# Patient Record
Sex: Male | Born: 1990 | Race: Black or African American | Hispanic: No | Marital: Single | Smoking: Former smoker
Health system: Southern US, Community
[De-identification: ages and names within clinical notes are randomized; demographics above are authoritative.]

---

## 2018-08-22 ENCOUNTER — Other Ambulatory Visit: Payer: Self-pay

## 2018-08-22 ENCOUNTER — Encounter (HOSPITAL_BASED_OUTPATIENT_CLINIC_OR_DEPARTMENT_OTHER): Payer: Self-pay

## 2018-08-22 ENCOUNTER — Emergency Department (HOSPITAL_BASED_OUTPATIENT_CLINIC_OR_DEPARTMENT_OTHER)
Admission: EM | Admit: 2018-08-22 | Discharge: 2018-08-22 | Disposition: A | Discharge status: Home / Self Care | Payer: Self-pay | Attending: Emergency Medicine | Admitting: Emergency Medicine

## 2018-08-22 DIAGNOSIS — L293 Anogenital pruritus, unspecified: Secondary | ICD-10-CM

## 2018-08-22 DIAGNOSIS — F17210 Nicotine dependence, cigarettes, uncomplicated: Secondary | ICD-10-CM | POA: Insufficient documentation

## 2018-08-22 DIAGNOSIS — A630 Anogenital (venereal) warts: Secondary | ICD-10-CM | POA: Insufficient documentation

## 2018-08-22 LAB — URINALYSIS, ROUTINE W REFLEX MICROSCOPIC
BILIRUBIN URINE: NEGATIVE
Glucose, UA: NEGATIVE mg/dL
HGB URINE DIPSTICK: NEGATIVE
Ketones, ur: 15 mg/dL — AB
Leukocytes, UA: NEGATIVE
Nitrite: NEGATIVE
PH: 8 (ref 5.0–8.0)
Protein, ur: NEGATIVE mg/dL
SPECIFIC GRAVITY, URINE: 1.015 (ref 1.005–1.030)

## 2018-08-22 NOTE — Discharge Instructions (Addendum)
Thank you for allowing me to care for you today in the Emergency Department.   Avoid use of any detergents, soaps, perfumes, condoms, and other chemicals, such as petroleum jelly, which may cause worsening itching.  Make sure that you dry the area thoroughly after getting out of the shower before putting on underwear.  You can also try using a Goldbond powder to the groin area, the area around the top of the bilateral thighs.   If your symptoms do not improve with this regimen, you can follow-up with [facility].  You can also follow-up with [facility] regarding the warts on your penis.  Your gonorrhea and Chlamydia test are pending today.  If positive, someone from the hospital will call you.  Return to the emergency department if you develop blood in your urine, burning when you pee, high fevers, swelling of the penis or scrotum, or other new, concerning symptoms.

## 2018-08-22 NOTE — ED Provider Notes (Signed)
[facility] EMERGENCY DEPARTMENT Provider Note   CSN: [id number] Arrival date & time: 08/22/18  1732     History   Chief Complaint Chief Complaint  Patient presents with  . Sore    HPI Kurt Moon is a 27 y.o. male with no pertinent past medical history who presents to the emergency department with a chief complaint of penile itching.  The patient endorses itching to the penis that began over the last few days.  He also reports multiple "bumps" to the penis that is been present for at least a year.  He denies penile discharge, dysuria, hematuria, rectal pain, rectal itching, sore throat, fever, chills, urinary frequency or hesitancy, back pain, or abdominal pain.  No treatment prior to arrival.  The history is provided by the patient. No language interpreter was used.    History reviewed. No pertinent past medical history.  There are no active problems to display for this patient.   History reviewed. No pertinent surgical history.      Home Medications    Prior to Admission medications   Not on File    Family History No family history on file.  Social History Social History   Tobacco Use  . Smoking status: Current Every Day Smoker    Types: Cigarettes  . Smokeless tobacco: Never Used  Substance Use Topics  . Alcohol use: Yes    Comment: occ  . Drug use: Never     Allergies   Patient has no known allergies.   Review of Systems Review of Systems  Constitutional: Negative for appetite change and fever.  Respiratory: Negative for shortness of breath.   Cardiovascular: Negative for chest pain.  Gastrointestinal: Negative for abdominal pain.  Genitourinary: Negative for dysuria.       Penis itching  Musculoskeletal: Negative for back pain.  Skin: Positive for rash. Negative for color change and wound.  Allergic/Immunologic: Negative for immunocompromised state.  Neurological: Negative for weakness, numbness and headaches.    Psychiatric/Behavioral: Negative for confusion.     Physical Exam Updated Vital Signs BP (!) 154/97 (BP Location: Right Arm)   Pulse 93   Temp 98.5 F (36.9 C) (Oral)   Resp 18   Ht 5\' 8"  (1.727 m)   Wt 97.1 kg   SpO2 98%   BMI 32.54 kg/m   Physical Exam  Constitutional: He appears well-developed.  HENT:  Head: Normocephalic.  Eyes: Conjunctivae are normal.  Neck: Neck supple.  Cardiovascular: Normal rate and regular rhythm.  No murmur heard. Pulmonary/Chest: Effort normal.  Abdominal: Soft. He exhibits no distension and no mass. There is no tenderness. There is no rebound and no guarding. No hernia.  Genitourinary: Testes normal. No phimosis, paraphimosis, penile erythema or penile tenderness. No discharge found.  Genitourinary Comments: Chaperoned  Multiple flesh-colored pedunculated lesions present over the penile shaft.  No erythema, edema, noticed at the urethral meatus, glans penis, or shaft of the penis.  Scrotum is unremarkable.  No discharge noticed from the urethral meatus.  Lymphadenopathy: No inguinal adenopathy noted on the right or left side.  Neurological: He is alert.  Skin: Skin is warm and dry.  Psychiatric: His behavior is normal.  Nursing note and vitals reviewed.  ED Treatments / Results  Labs (all labs ordered are listed, but only abnormal results are displayed) Labs Reviewed  URINALYSIS, ROUTINE W REFLEX MICROSCOPIC - Abnormal; Notable for the following components:      Result Value   Ketones, ur 15 (*)  All other components within normal limits  GC/CHLAMYDIA PROBE AMP ([place]) NOT AT [facility]    EKG None  Radiology No results found.  Procedures Procedures (including critical care time)  Medications Ordered in ED Medications - No data to display   Initial Impression / Assessment and Plan / ED Course  I have reviewed the triage vital signs and the nursing notes.  Pertinent labs & imaging results that were available during my  care of the patient were reviewed by me and considered in my medical decision making (see chart for details).     27 year old male with no pertinent past medical history presenting with penile itching and rash.  Patient endorses multiple flesh-colored, pedunculated lesions to the shaft of the penis that are consistent with genital warts.  These of been present for the last year, but penile itching is acute.  On exam, no evidence of balanitis.  UA is unremarkable.  Gonorrhea and Chlamydia are pending, but less likely given that the patient has no other associated symptoms.  In the absence of physical findings on exam, will discharge the patient with supportive measures for good hygiene and follow-up with urology for genital warts and if pruritus does not resolve with supportive care.  Strict return precautions given.  The patient is hemodynamically stable and in no acute distress.  He is safe for discharge home with outpatient follow-up.  Final Clinical Impressions(s) / ED Diagnoses   Final diagnoses:  Penile pruritus  Genital warts    ED Discharge Orders    None       Avrey Hyser A, PA-C 08/23/18 0104    Rolan Bucco, MD 08/25/18 1235

## 2018-08-22 NOTE — ED Triage Notes (Signed)
C/o bumps, itching to penis x "couple weeks"-denies pain and d/c-NAD-steady gait

## 2018-08-23 LAB — GC/CHLAMYDIA PROBE AMP (~~LOC~~) NOT AT ARMC
Chlamydia: NEGATIVE
Neisseria Gonorrhea: NEGATIVE

## 2018-12-08 ENCOUNTER — Emergency Department (HOSPITAL_BASED_OUTPATIENT_CLINIC_OR_DEPARTMENT_OTHER)
Admission: EM | Admit: 2018-12-08 | Discharge: 2018-12-08 | Disposition: A | Discharge status: Home / Self Care | Payer: Self-pay | Attending: Emergency Medicine | Admitting: Emergency Medicine

## 2018-12-08 ENCOUNTER — Other Ambulatory Visit: Payer: Self-pay

## 2018-12-08 ENCOUNTER — Encounter (HOSPITAL_BASED_OUTPATIENT_CLINIC_OR_DEPARTMENT_OTHER): Payer: Self-pay | Admitting: *Deleted

## 2018-12-08 ENCOUNTER — Emergency Department (HOSPITAL_BASED_OUTPATIENT_CLINIC_OR_DEPARTMENT_OTHER): Payer: Self-pay

## 2018-12-08 DIAGNOSIS — J069 Acute upper respiratory infection, unspecified: Secondary | ICD-10-CM | POA: Insufficient documentation

## 2018-12-08 DIAGNOSIS — R030 Elevated blood-pressure reading, without diagnosis of hypertension: Secondary | ICD-10-CM | POA: Insufficient documentation

## 2018-12-08 DIAGNOSIS — B9789 Other viral agents as the cause of diseases classified elsewhere: Secondary | ICD-10-CM

## 2018-12-08 DIAGNOSIS — F17210 Nicotine dependence, cigarettes, uncomplicated: Secondary | ICD-10-CM | POA: Insufficient documentation

## 2018-12-08 LAB — GROUP A STREP BY PCR: GROUP A STREP BY PCR: NOT DETECTED

## 2018-12-08 MED ORDER — BENZONATATE 100 MG PO CAPS
100.0000 mg | ORAL_CAPSULE | Freq: Once | ORAL | Status: AC
  Administered 2018-12-08: 100 mg via ORAL
  Filled 2018-12-08: qty 1

## 2018-12-08 MED ORDER — BENZONATATE 100 MG PO CAPS: 100.0000 mg | ORAL_CAPSULE | Freq: Three times a day (TID) | ORAL | 0 refills | Status: AC | PRN

## 2018-12-08 NOTE — ED Provider Notes (Signed)
[facility] EMERGENCY DEPARTMENT Provider Note   CSN: [id number] Arrival date & time: 12/08/18  1613    History   Chief Complaint Chief Complaint  Patient presents with  . Cough    flu Sx    HPI Kurt Moon is a 28 y.o. male.     The history is provided by the patient. No language interpreter was used.  Cough   Kurt Moon is a 28 y.o. male who presents to the Emergency Department complaining of cough.  He presents to the ED complaining of cough that began two weeks ago.  He has associated body aches, chills, sore throat, nasal congestion.  Sxs began with cough, sore throat has been present for one week described as soreness anteriorly with coughing.  Cough is productive of green sputum.  For the last few days his face around his eyes has felt swollen in the morning.  No reports of fevers, N/V/abdominal pain, leg swelling or pain.  He does have sick contacts in the household with URI sxs.  No recent travel.  He works in a [employer] administrator.  He has tried OTC cough medications with minimal relief.  He smokes tobacco and uses marijuana, occasional alcohol use.  No urinary changes History reviewed. No pertinent past medical history.  There are no active problems to display for this patient.   History reviewed. No pertinent surgical history.      Home Medications    Prior to Admission medications   Medication Sig Start Date End Date Taking? Authorizing Provider  benzonatate (TESSALON) 100 MG capsule Take 1 capsule (100 mg total) by mouth 3 (three) times daily as needed for cough. 12/08/18   Tilden Fossa, MD    Family History No family history on file.  Social History Social History   Tobacco Use  . Smoking status: Current Every Day Smoker    Types: Cigarettes  . Smokeless tobacco: Never Used  Substance Use Topics  . Alcohol use: Yes    Comment: occ  . Drug use: Never     Allergies   Patient has no known allergies.   Review of  Systems Review of Systems  Respiratory: Positive for cough.   All other systems reviewed and are negative.    Physical Exam Updated Vital Signs BP (!) 158/106 (BP Location: Right Arm)   Pulse 88   Temp 98.5 F (36.9 C) (Oral)   Resp 18   Ht 6' (1.829 m)   Wt 99.8 kg   SpO2 100%   BMI 29.84 kg/m   Physical Exam Vitals signs and nursing note reviewed.  Constitutional:      Appearance: He is well-developed.  HENT:     Head: Normocephalic and atraumatic.     Mouth/Throat:     Mouth: Mucous membranes are moist.     Comments: No significant erythema, edema or exudates in posterior OP Eyes:     Conjunctiva/sclera: Conjunctivae normal.     Comments: No significant peri orbital edema.   Neck:     Musculoskeletal: Neck supple.  Cardiovascular:     Rate and Rhythm: Normal rate and regular rhythm.     Heart sounds: No murmur.  Pulmonary:     Effort: Pulmonary effort is normal. No respiratory distress.     Breath sounds: Normal breath sounds.  Abdominal:     Palpations: Abdomen is soft.     Tenderness: There is no abdominal tenderness. There is no guarding or rebound.  Musculoskeletal:  General: No swelling or tenderness.  Lymphadenopathy:     Cervical: No cervical adenopathy.  Skin:    General: Skin is warm and dry.  Neurological:     Mental Status: He is alert and oriented to person, place, and time.  Psychiatric:        Behavior: Behavior normal.      ED Treatments / Results  Labs (all labs ordered are listed, but only abnormal results are displayed) Labs Reviewed  GROUP A STREP BY PCR    EKG None  Radiology Dg Chest 2 View  Result Date: 12/08/2018 CLINICAL DATA:  Cough, congestion, fever EXAM: CHEST - 2 VIEW COMPARISON:  None. FINDINGS: The heart size and mediastinal contours are within normal limits. Low volume examination without acute airspace opacity. The visualized skeletal structures are unremarkable. IMPRESSION: Low volume examination without  acute abnormality of the lungs. Electronically Signed   By: Lauralyn Primes M.D.   On: 12/08/2018 17:09    Procedures Procedures (including critical care time)  Medications Ordered in ED Medications  benzonatate (TESSALON) capsule 100 mg (100 mg Oral Given 12/08/18 1651)     Initial Impression / Assessment and Plan / ED Course  I have reviewed the triage vital signs and the nursing notes.  Pertinent labs & imaging results that were available during my care of the patient were reviewed by me and considered in my medical decision making (see chart for details).        Patient here for evaluation of cough, sore throat and facial swelling. He is non-toxic appearing on evaluation with no respiratory distress. There is no clear evidence of facial edema, CHF, pneumonia, PTA, RPA, epiglotitis on evaluation. He has multiple sick contacts at home. Discussed home care for viral URI with cough. Discussed with patient findings of elevated blood pressure. Discussed outpatient follow-up and return precautions.  Final Clinical Impressions(s) / ED Diagnoses   Final diagnoses:  Viral URI with cough  Elevated blood pressure reading without diagnosis of hypertension    ED Discharge Orders         Ordered    benzonatate (TESSALON) 100 MG capsule  3 times daily PRN     12/08/18 1723           Tilden Fossa, MD 12/08/18 1725

## 2018-12-08 NOTE — ED Triage Notes (Addendum)
Pt reports cough x 2 weeks and "flu symptoms". States having facial swelling x 3 days and indicates around his eyes. Reports sore throat since thursday

## 2020-03-13 IMAGING — CR DG CHEST 2V
2 series · 2 of 2 positions shown · non-contrast
Comparison: None.

CLINICAL DATA: Cough, congestion, fever

EXAM:
CHEST - 2 VIEW

[w chest pa]
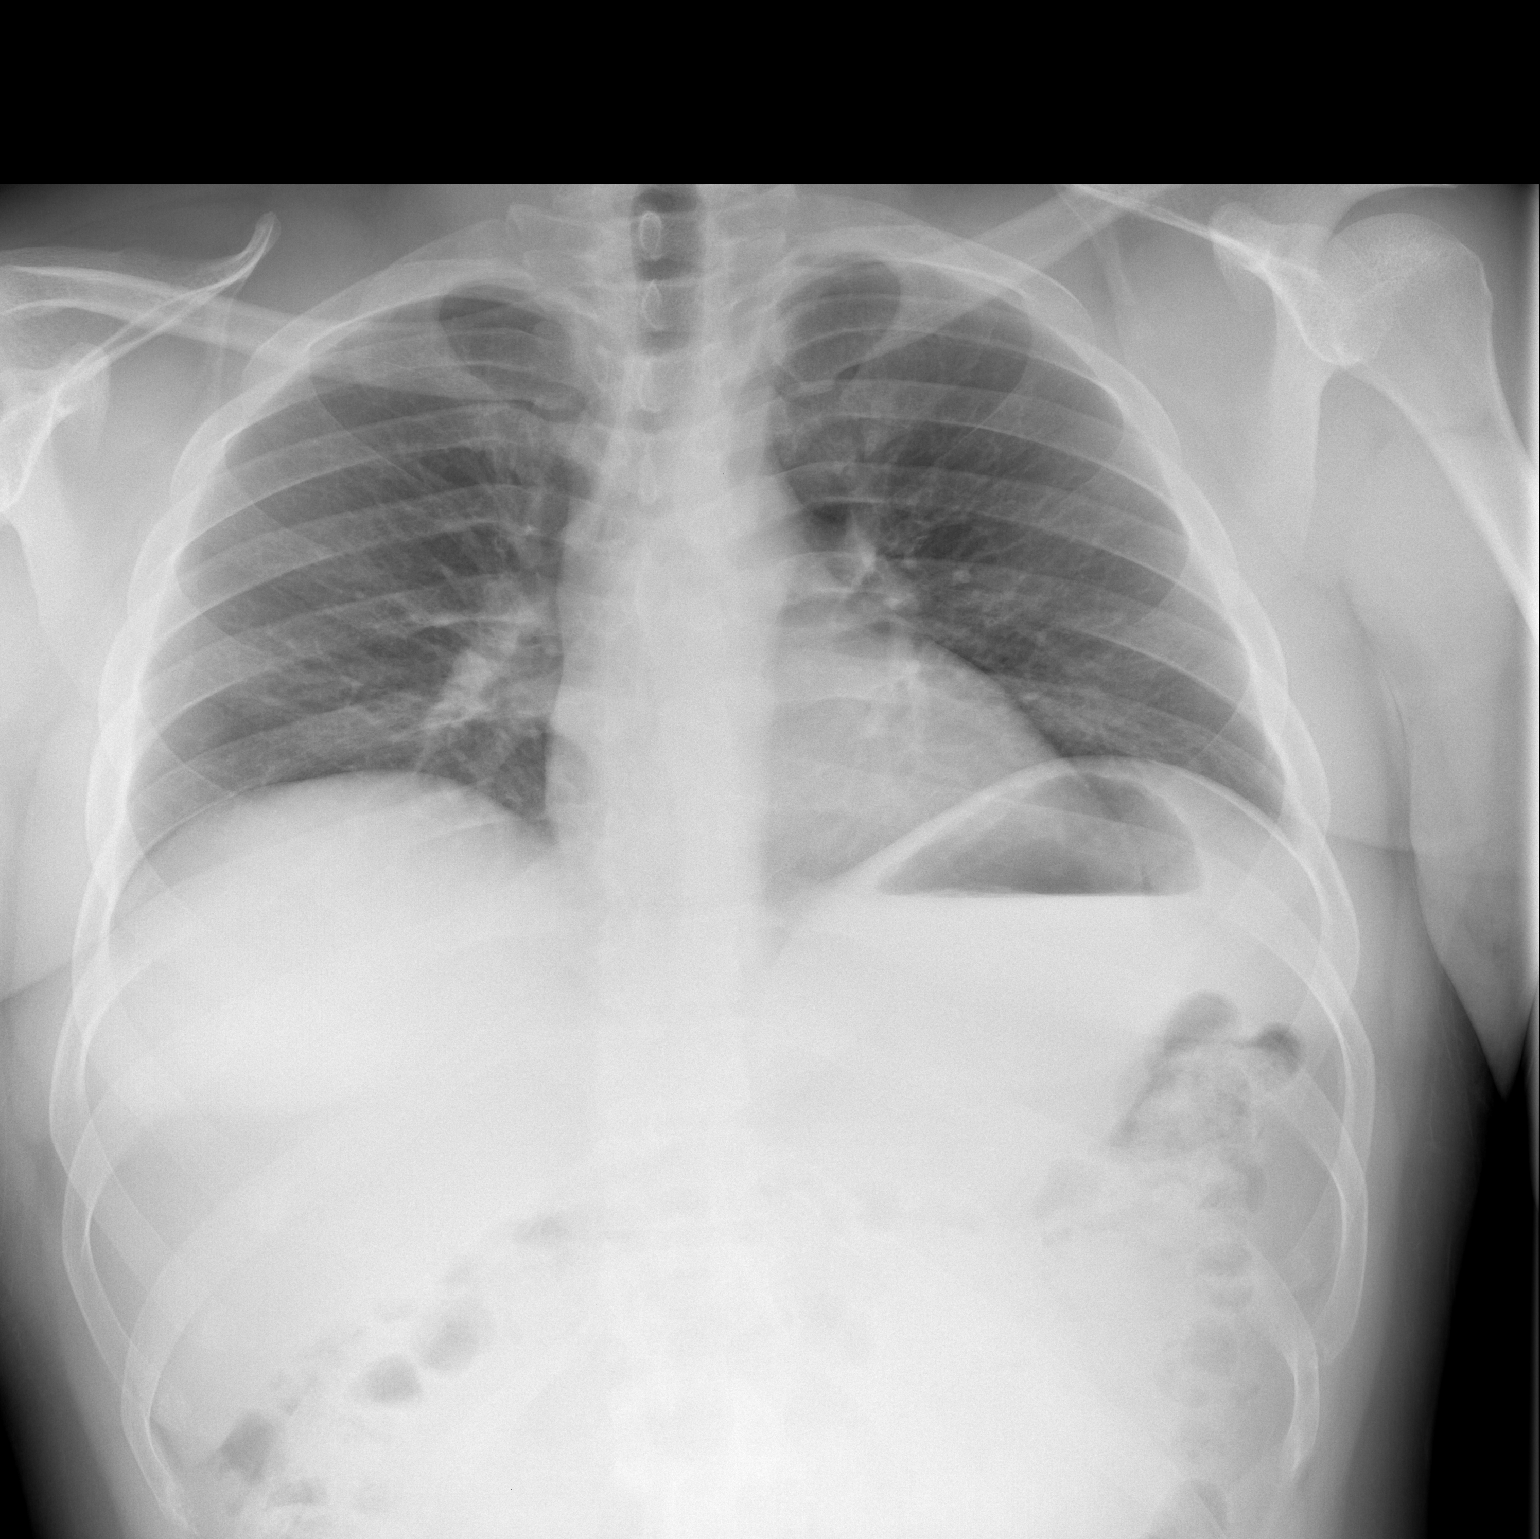

[w chest lat]
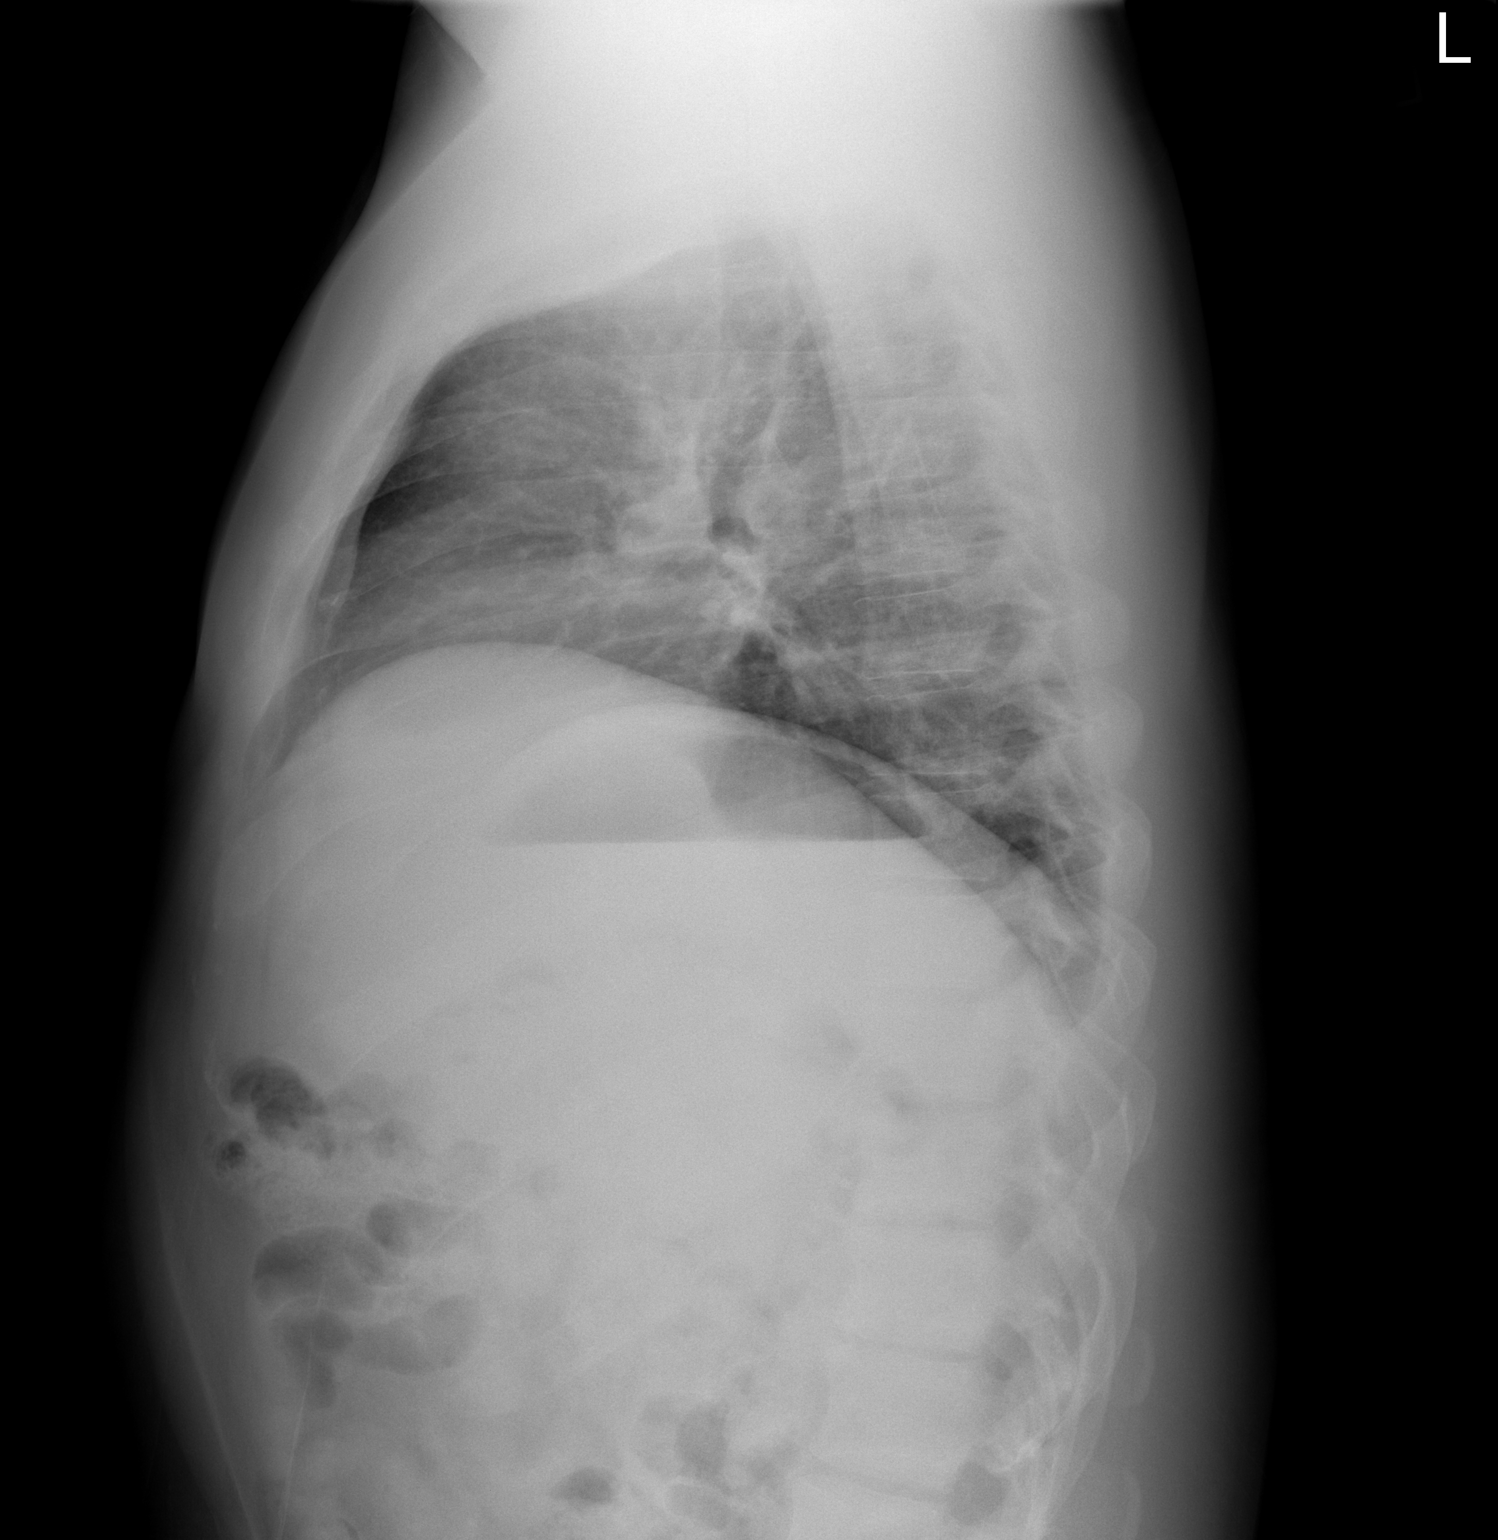

[2 of 2 positions shown; findings below may reference images not displayed]

FINDINGS: The heart size and mediastinal contours are within normal limits.
Low volume examination without acute airspace opacity. The
visualized skeletal structures are unremarkable.
IMPRESSION: Low volume examination without acute abnormality of the lungs.

## 2020-07-29 ENCOUNTER — Emergency Department (HOSPITAL_BASED_OUTPATIENT_CLINIC_OR_DEPARTMENT_OTHER)
Admission: EM | Admit: 2020-07-29 | Discharge: 2020-07-29 | Disposition: A | Discharge status: Home / Self Care | Payer: HRSA Program | Attending: Emergency Medicine | Admitting: Emergency Medicine

## 2020-07-29 ENCOUNTER — Other Ambulatory Visit: Payer: Self-pay

## 2020-07-29 DIAGNOSIS — Z20822 Contact with and (suspected) exposure to covid-19: Secondary | ICD-10-CM | POA: Insufficient documentation

## 2020-07-29 DIAGNOSIS — F17210 Nicotine dependence, cigarettes, uncomplicated: Secondary | ICD-10-CM | POA: Insufficient documentation

## 2020-07-29 DIAGNOSIS — H66001 Acute suppurative otitis media without spontaneous rupture of ear drum, right ear: Secondary | ICD-10-CM

## 2020-07-29 DIAGNOSIS — J069 Acute upper respiratory infection, unspecified: Secondary | ICD-10-CM | POA: Diagnosis not present

## 2020-07-29 DIAGNOSIS — R059 Cough, unspecified: Secondary | ICD-10-CM | POA: Diagnosis present

## 2020-07-29 DIAGNOSIS — H66002 Acute suppurative otitis media without spontaneous rupture of ear drum, left ear: Secondary | ICD-10-CM | POA: Insufficient documentation

## 2020-07-29 LAB — RESPIRATORY PANEL BY RT PCR (FLU A&B, COVID)
Influenza A by PCR: NEGATIVE
Influenza B by PCR: NEGATIVE
SARS Coronavirus 2 by RT PCR: NEGATIVE

## 2020-07-29 MED ORDER — AMOXICILLIN 500 MG PO TABS: 1000.0000 mg | ORAL_TABLET | Freq: Two times a day (BID) | ORAL | 0 refills | Status: AC

## 2020-07-29 NOTE — ED Notes (Addendum)
Pt ambulated from triage to room 11 with O2 stats at 97%. Pt gate is steady. Pt does not seem in distress.  And breathing is even.

## 2020-07-29 NOTE — ED Provider Notes (Signed)
[facility] EMERGENCY DEPARTMENT Provider Note   CSN: [id number] Arrival date & time: 07/29/20  8250     History Chief Complaint  Patient presents with  . Cough    Kurt Moon is a 29 y.o. male.  29 yo M with a chief complaints of congestion and right ear pain rhinorrhea headache.  Going on for about 48 hours now.  No fevers.  Mild cough.  No nausea vomiting or diarrhea.  No known sick contacts.  No rashes.  The history is provided by the patient.  Cough Associated symptoms: ear pain and rhinorrhea   Associated symptoms: no chest pain, no chills, no eye discharge, no fever, no headaches, no myalgias, no rash, no shortness of breath and no sore throat   Illness Severity:  Moderate Onset quality:  Gradual Duration:  2 days Timing:  Constant Progression:  Worsening Chronicity:  New Associated symptoms: congestion, cough, ear pain and rhinorrhea   Associated symptoms: no abdominal pain, no chest pain, no diarrhea, no fever, no headaches, no myalgias, no rash, no shortness of breath, no sore throat and no vomiting        No past medical history on file.  There are no problems to display for this patient.   No past surgical history on file.     No family history on file.  Social History   Tobacco Use  . Smoking status: Current Every Day Smoker    Types: Cigarettes  . Smokeless tobacco: Never Used  Vaping Use  . Vaping Use: Never used  Substance Use Topics  . Alcohol use: Yes    Comment: occ  . Drug use: Never    Home Medications Prior to Admission medications   Medication Sig Start Date End Date Taking? Authorizing Provider  amoxicillin (AMOXIL) 500 MG tablet Take 2 tablets (1,000 mg total) by mouth 2 (two) times daily. 07/29/20   Melene Plan, DO  benzonatate (TESSALON) 100 MG capsule Take 1 capsule (100 mg total) by mouth 3 (three) times daily as needed for cough. 12/08/18   Tilden Fossa, MD    Allergies    Patient has no known  allergies.  Review of Systems   Review of Systems  Constitutional: Negative for chills and fever.  HENT: Positive for congestion, ear pain, postnasal drip and rhinorrhea. Negative for facial swelling, sore throat and trouble swallowing.   Eyes: Negative for discharge and visual disturbance.  Respiratory: Positive for cough. Negative for shortness of breath.   Cardiovascular: Negative for chest pain and palpitations.  Gastrointestinal: Negative for abdominal pain, diarrhea and vomiting.  Musculoskeletal: Negative for arthralgias and myalgias.  Skin: Negative for color change and rash.  Neurological: Negative for tremors, syncope and headaches.  Psychiatric/Behavioral: Negative for confusion and dysphoric mood.    Physical Exam Updated Vital Signs BP (!) 141/102 (BP Location: Right Arm)   Pulse 75   Temp 98.6 F (37 C) (Oral)   Resp 16   Ht 5\' 8"  (1.727 m)   Wt 99.8 kg   SpO2 99%   BMI 33.45 kg/m   Physical Exam Vitals and nursing note reviewed.  Constitutional:      Appearance: He is well-developed.  HENT:     Head: Normocephalic and atraumatic.     Comments: Swollen turbinates, posterior nasal drip, no noted sinus ttp, right TM with a purulent effusion with distortion of landmarks and erythema.  Eyes:     Pupils: Pupils are equal, round, and reactive to light.  Neck:  Vascular: No JVD.  Cardiovascular:     Rate and Rhythm: Normal rate and regular rhythm.     Heart sounds: No murmur heard.  No friction rub. No gallop.   Pulmonary:     Effort: No respiratory distress.     Breath sounds: No wheezing.  Abdominal:     General: There is no distension.     Tenderness: There is no guarding or rebound.  Musculoskeletal:        General: Normal range of motion.     Cervical back: Normal range of motion and neck supple.  Skin:    Coloration: Skin is not pale.     Findings: No rash.  Neurological:     Mental Status: He is alert and oriented to person, place, and time.   Psychiatric:        Behavior: Behavior normal.     ED Results / Procedures / Treatments   Labs (all labs ordered are listed, but only abnormal results are displayed) Labs Reviewed  RESPIRATORY PANEL BY RT PCR (FLU A&B, COVID)    EKG None  Radiology No results found.  Procedures Procedures (including critical care time)  Medications Ordered in ED Medications - No data to display  ED Course  I have reviewed the triage vital signs and the nursing notes.  Pertinent labs & imaging results that were available during my care of the patient were reviewed by me and considered in my medical decision making (see chart for details).    MDM Rules/Calculators/A&P                          29 yo M with a chief complaints of rhinorrhea and right ear pain.  Going on for 48 hours.  He is concerned that he has the novel coronavirus.  We will send off a test.  Seems less likely to be Covid based on his symptoms.  Clinically does have a right otitis media.  Will treat with antibiotics.  PCP follow-up.  Kurt Moon was evaluated in Emergency Department on 07/29/2020 for the symptoms described in the history of present illness. He/she was evaluated in the context of the global COVID-19 pandemic, which necessitated consideration that the patient might be at risk for infection with the SARS-CoV-2 virus that causes COVID-19. Institutional protocols and algorithms that pertain to the evaluation of patients at risk for COVID-19 are in a state of rapid change based on information released by regulatory bodies including the CDC and federal and state organizations. These policies and algorithms were followed during the patient's care in the ED.   7:44 AM:  I have discussed the diagnosis/risks/treatment options with the patient and believe the pt to be eligible for discharge home to follow-up with PCP. We also discussed returning to the ED immediately if new or worsening sx occur. We discussed the sx which  are most concerning (e.g., sudden worsening pain, fever, inability to tolerate by mouth) that necessitate immediate return. Medications administered to the patient during their visit and any new prescriptions provided to the patient are listed below.  Medications given during this visit Medications - No data to display   The patient appears reasonably screen and/or stabilized for discharge and I doubt any other medical condition or other [facility] requiring further screening, evaluation, or treatment in the ED at this time prior to discharge.   Final Clinical Impression(s) / ED Diagnoses Final diagnoses:  Viral URI  Acute suppurative otitis media of  right ear without spontaneous rupture of tympanic membrane, recurrence not specified    Rx / DC Orders ED Discharge Orders         Ordered    amoxicillin (AMOXIL) 500 MG tablet  2 times daily        07/29/20 0724           Melene Plan, DO 07/29/20 [phone]

## 2020-07-29 NOTE — ED Triage Notes (Signed)
Pt presents from home via POV for COVID like symptoms- runny nose, sore throat, slight cough. Denies exposure

## 2020-07-29 NOTE — Discharge Instructions (Signed)
Take tylenol 2 pills 4 times a day and motrin 4 pills 3 times a day.  Drink plenty of fluids.  Return for worsening shortness of breath, headache, confusion. Follow up with your family doctor.   

## 2021-01-25 ENCOUNTER — Emergency Department (HOSPITAL_BASED_OUTPATIENT_CLINIC_OR_DEPARTMENT_OTHER)
Admission: EM | Admit: 2021-01-25 | Discharge: 2021-01-25 | Disposition: A | Discharge status: Home / Self Care | Payer: Self-pay | Attending: Emergency Medicine | Admitting: Emergency Medicine

## 2021-01-25 ENCOUNTER — Encounter (HOSPITAL_BASED_OUTPATIENT_CLINIC_OR_DEPARTMENT_OTHER): Payer: Self-pay

## 2021-01-25 ENCOUNTER — Other Ambulatory Visit: Payer: Self-pay

## 2021-01-25 DIAGNOSIS — X501XXA Overexertion from prolonged static or awkward postures, initial encounter: Secondary | ICD-10-CM | POA: Insufficient documentation

## 2021-01-25 DIAGNOSIS — M79641 Pain in right hand: Secondary | ICD-10-CM

## 2021-01-25 DIAGNOSIS — Z87891 Personal history of nicotine dependence: Secondary | ICD-10-CM | POA: Insufficient documentation

## 2021-01-25 DIAGNOSIS — M79642 Pain in left hand: Secondary | ICD-10-CM

## 2021-01-25 DIAGNOSIS — R202 Paresthesia of skin: Secondary | ICD-10-CM | POA: Insufficient documentation

## 2021-01-25 MED ORDER — DICLOFENAC SODIUM 1 % EX GEL: 2.0000 g | Freq: Four times a day (QID) | CUTANEOUS | 0 refills | Status: AC | PRN

## 2021-01-25 MED ORDER — IBUPROFEN 800 MG PO TABS: 800.0000 mg | ORAL_TABLET | Freq: Three times a day (TID) | ORAL | 0 refills | Status: AC | PRN

## 2021-01-25 NOTE — ED Provider Notes (Signed)
Emergency Department Provider Note   I have reviewed the triage vital signs and the nursing notes.   HISTORY  Chief Complaint Hand Pain   HPI Kurt Moon is a 30 y.o. male presents to the emergency department with bilateral hand pain.  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  He states this requires both hands to operate and produces a forceful vibration.  Pain is mainly across the mid hand and is worse with using the drill.  He does not have pain in the wrists, elbows, forearms.  No shoulder or neck pain.  He is tried Tylenol and ibuprofen with some mild relief but then pain symptoms returned after he returns to work.  He notes occasional tingling in the fingers but no numbness or tingling currently. No other modifying factors.   History reviewed. No pertinent past medical history.  There are no problems to display for this patient.   History reviewed. No pertinent surgical history.  Allergies Patient has no known allergies.  No family history on file.  Social History Social History   Tobacco Use  . Smoking status: Former Games developer  . Smokeless tobacco: Never Used  Vaping Use  . Vaping Use: Never used  Substance Use Topics  . Alcohol use: Yes    Comment: occ  . Drug use: Never    Review of Systems  Constitutional: No fever/chills Musculoskeletal: Positive bilateral hand pain.  Skin: Negative for rash. Neurological: Negative for numbness.   ____________________________________________   PHYSICAL EXAM:  VITAL SIGNS: ED Triage Vitals  Enc Vitals Group     BP 01/25/21 1132 (!) 166/106     Pulse Rate 01/25/21 1132 91     Resp 01/25/21 1132 18     Temp 01/25/21 1132 98.7 F (37.1 C)     Temp Source 01/25/21 1132 Oral     SpO2 01/25/21 1132 97 %     Weight 01/25/21 1133 254 lb (115.2 kg)     Height 01/25/21 1133 5\' 9"  (1.753 m)   Constitutional: Alert and oriented.  Well appearing and in no acute distress. Eyes: Conjunctivae are normal.  Head: Atraumatic. Neck: No stridor.  Cardiovascular: Good peripheral circulation with normal radial pulses and brisk capillary refill.  Respiratory: Normal respiratory effort.  Gastrointestinal: No distention.  Musculoskeletal: No lower extremity tenderness nor edema. No gross deformities of extremities.  Normal range of motion of the fingers, wrists, elbows bilaterally.  No joint redness, warmth, effusions.  No deformity. No skin breakdown.  Neurologic:  Normal speech and language. Normal sensation and strength in the bilateral upper extremities.  Skin:  Skin is warm, dry and intact. No rash noted.  ____________________________________________  RADIOLOGY  None  ____________________________________________   PROCEDURES  Procedure(s) performed:   Procedures  None  ____________________________________________   INITIAL IMPRESSION / ASSESSMENT AND PLAN / ED COURSE  Pertinent labs & imaging results that were available during my care of the patient were reviewed by me and considered in my medical decision making (see chart for details).   Patient presents to the emergency department with bilateral hand pain made worse with his job using a large drill.  He does not have focal bony tenderness.  No joint effusions to suspect rheumatologic issue.  Exam not consistent with septic joint.  No acute injury to suspect fracture or dislocation.  I do not see indication for emergency imaging of the bilateral hands for  pain.  Plan for NSAIDs, work note, topical arthritis gel, and contact information for sports medicine should symptoms persist.  Suspect that pain is related to repetitive use but patient is unsure if he can change job duties but will enquire.    ____________________________________________  FINAL CLINICAL IMPRESSION(S) / ED DIAGNOSES  Final diagnoses:  Right hand pain  Left hand pain    NEW OUTPATIENT  MEDICATIONS STARTED DURING THIS VISIT:  New Prescriptions   DICLOFENAC SODIUM (VOLTAREN) 1 % GEL    Apply 2 g topically 4 (four) times daily as needed.   IBUPROFEN (ADVIL) 800 MG TABLET    Take 1 tablet (800 mg total) by mouth every 8 (eight) hours as needed for moderate pain.    Note:  This document was prepared using Dragon voice recognition software and may include unintentional dictation errors.  Alona Bene, MD, [facility]    Chaundra Abreu, Arlyss Repress, MD 01/25/21 [phone]

## 2021-01-25 NOTE — Discharge Instructions (Signed)
You were seen in the emerge department today with hand pain.  I am starting her on 2 medications to help with symptoms and will take her out of work for the next 2 days.  Please follow-up with both your primary care doctor as well as the sports medicine doctor listed. Return to the ED with any new or worsening symptoms.

## 2021-01-25 NOTE — ED Triage Notes (Signed)
Pt c/o bilat hand pain "for a while" after using a drill at work-denies injury-NAD-steady gait
